# Patient Record
Sex: Male | Born: 1945 | Race: White | Hispanic: No | Marital: Married
Health system: Southern US, Community
[De-identification: ages and names within clinical notes are randomized; demographics above are authoritative.]

---

## 2019-08-23 ENCOUNTER — Ambulatory Visit: Payer: BC Managed Care – PPO | Attending: Internal Medicine

## 2019-08-23 DIAGNOSIS — Z23 Encounter for immunization: Secondary | ICD-10-CM | POA: Insufficient documentation

## 2019-08-23 NOTE — Progress Notes (Signed)
   Covid-[facility] Vaccination [facility]  Name:  Eugene Wall    MRN: [id number] DOB: 11/11/1945  08/23/2019  [name] was observed post Covid-19 immunization for 15 minutes without incidence. He was provided with Vaccine Information Sheet and instruction to access the V-Safe system.   [name] was instructed to call 911 with any severe reactions post vaccine: Marland Kitchen Difficulty breathing  . Swelling of your face and throat  . A fast heartbeat  . A bad rash all over your body  . Dizziness and weakness    Immunizations Administered    Name Date Dose VIS Date Route   Pfizer COVID-19 Vaccine 08/23/2019  1:49 PM 0.3 mL 07/14/2019 Intramuscular   Manufacturer: [employer], [employer]   Lot: [id number]   NDC: [address]-7

## 2019-09-13 ENCOUNTER — Ambulatory Visit: Payer: BC Managed Care – PPO | Attending: Internal Medicine

## 2019-09-13 DIAGNOSIS — Z23 Encounter for immunization: Secondary | ICD-10-CM

## 2019-09-13 NOTE — Progress Notes (Signed)
   Covid-19 Vaccination Clinic  Name:  Eugene Wall    MRN: [id number] DOB: March 11, 1946  09/13/2019  Eugene Wall was observed post Covid-19 immunization for 15 minutes without incidence. He was provided with Vaccine Information Sheet and instruction to access the V-Safe system.   Eugene Wall was instructed to call 911 with any severe reactions post vaccine: Marland Kitchen Difficulty breathing  . Swelling of your face and throat  . A fast heartbeat  . A bad rash all over your body  . Dizziness and weakness    Immunizations Administered    Name Date Dose VIS Date Route   Pfizer COVID-19 Vaccine 09/13/2019 11:48 AM 0.3 mL 07/14/2019 Intramuscular   Manufacturer: [employer], [employer]   Lot: [id number]   NDC: [address]-0

## 2023-04-13 IMAGING — MR MRI BRAIN WITHOUT CONTRAST
7 of 11 series · 23 of 48 positions shown · IV contrast (gadolinium)
Comparison: None

________________________________________________________________________________________________ 
MRI BRAIN WITHOUT CONTRAST, 04/13/2023 [DATE]: 
CLINICAL INDICATION: Decreased cognitive function. Evaluate for stroke.
TECHNIQUE: Multiplanar, multiecho position MR images of the brain were performed 
without intravenous gadolinium enhancement. Patient was scanned on a
magnet.

[Series 102: mpr - smartbrain · axial · 1.1mm · 1.09mm/px · 1 of 2 slices shown]
[im 1/2]
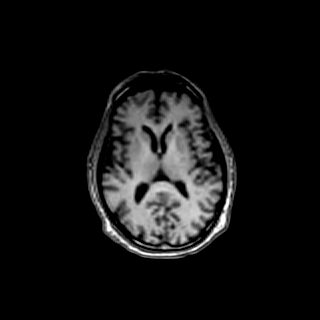

[Series 203: dadc map · coronal · 5.0mm · 0.81mm/px · 3 of 34 slices shown (1 of 2)]
[im 1/34]
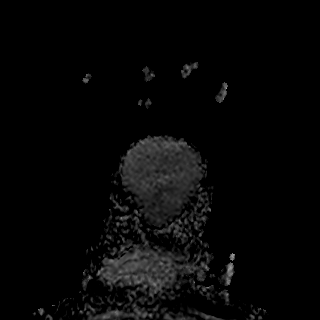
[im 17/34]
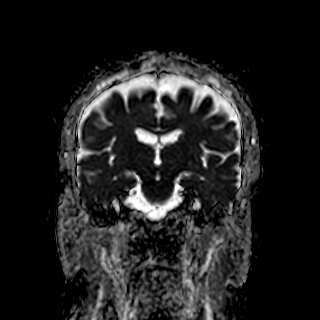
[im 34/34]
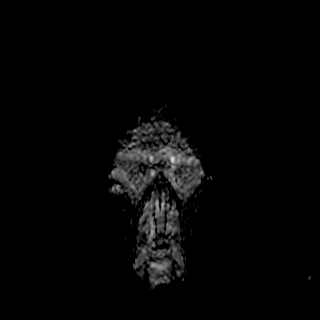

[Series 204: (id) · coronal · 5.0mm · 0.81mm/px · 3 of 34 slices shown (1 of 2)]
[im 1/34]
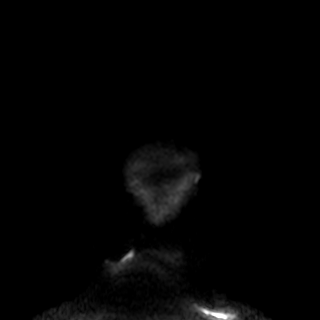
[im 17/34]
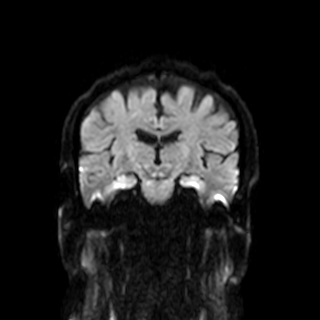
[im 34/34]
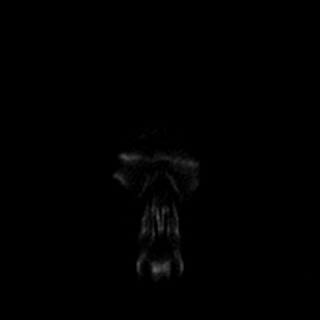

[Series 303: dadc map · axial · 5.0mm · 1.03mm/px · z∈[-75,+81]mm · 2 of 27 slices shown (2 of 2)]
[im 1/27]
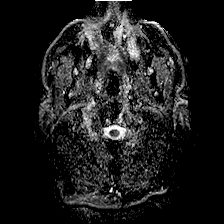
[im 27/27]
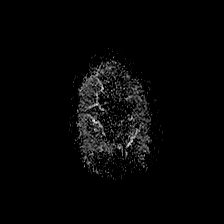

[Series 304: (id) · axial · 5.0mm · 1.03mm/px · z∈[-75,+81]mm · 2 of 27 slices shown (2 of 2)]
[im 1/27]
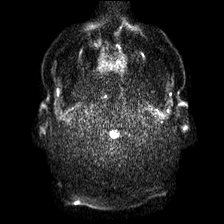
[im 27/27]
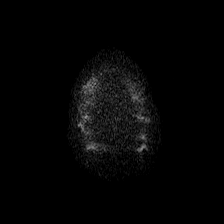

[Series 401: t1_se_sag · sagittal · 4.0mm · 0.39mm/px · 2 of 28 slices shown]
[im 1/28]
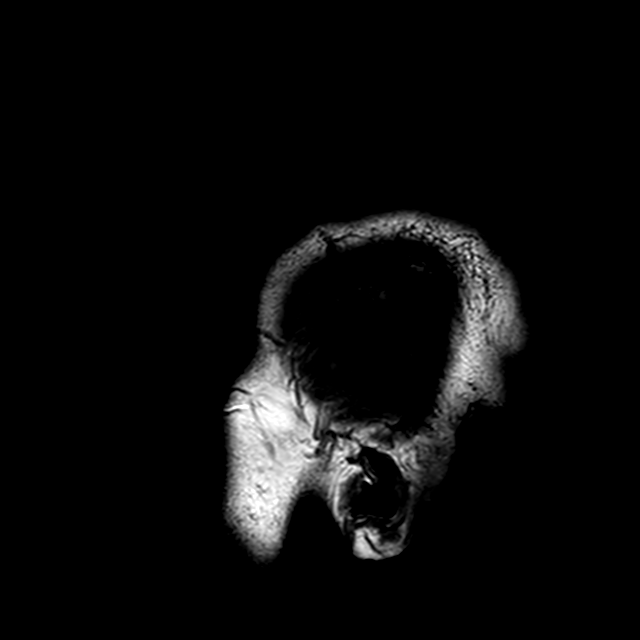
[im 28/28]
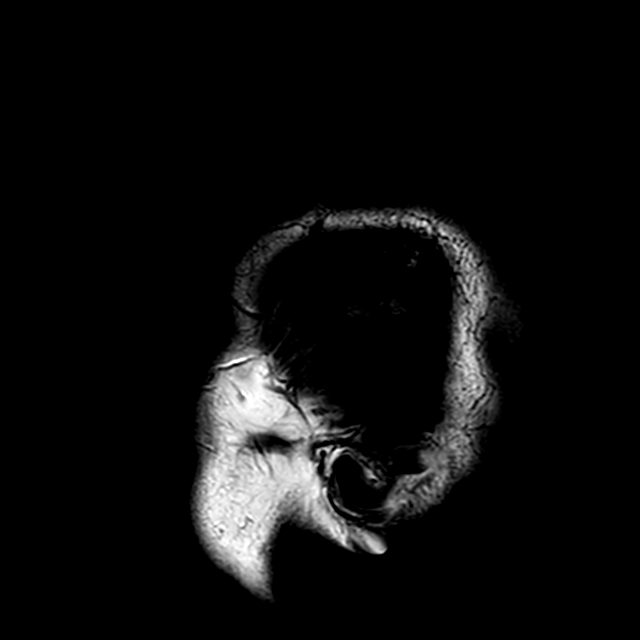

[Series 601: SWI · axial · 3.0mm · 0.40mm/px · z∈[-66,+74]mm · 10 of 200 slices shown]
[im 14/200]
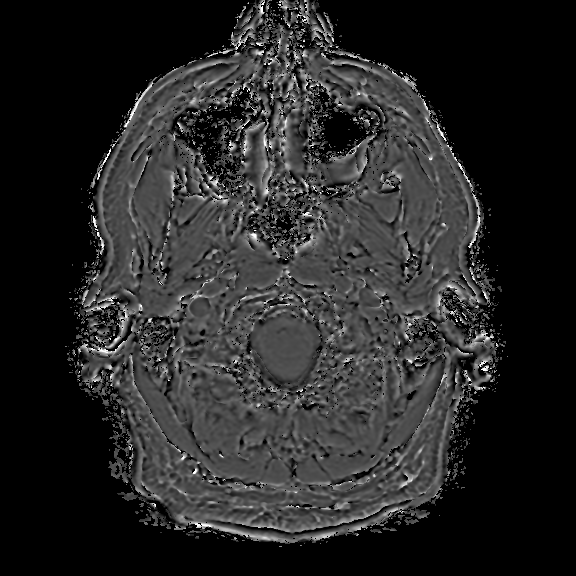
[im 27/200]
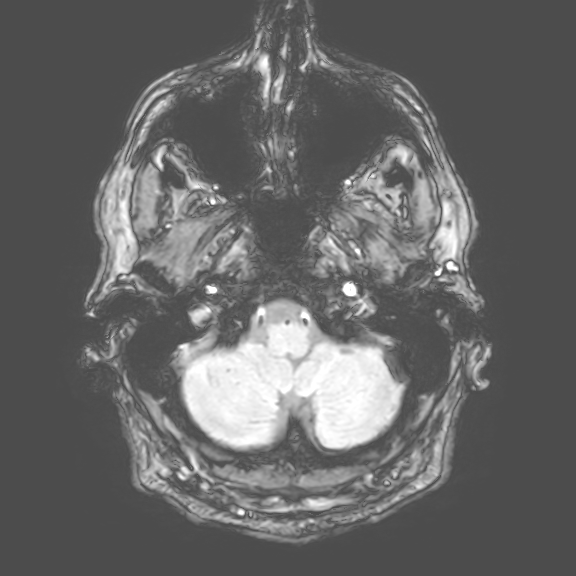
[im 40/200]
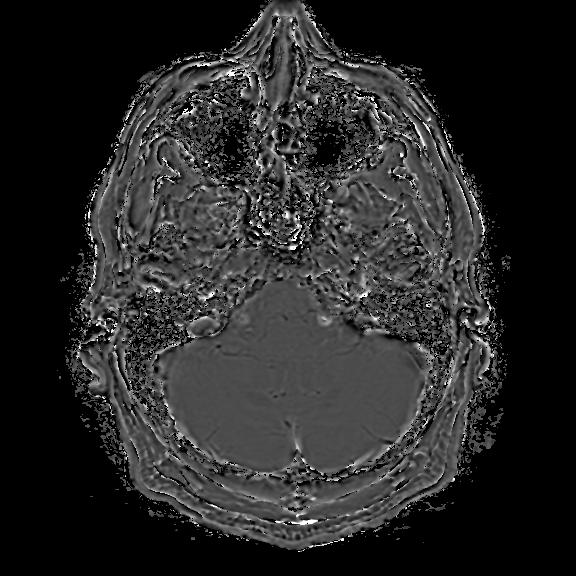
[im 67/200]
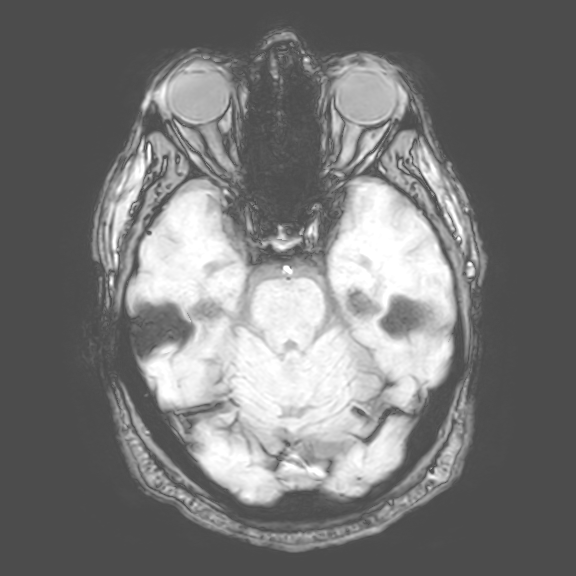
[im 93/200]
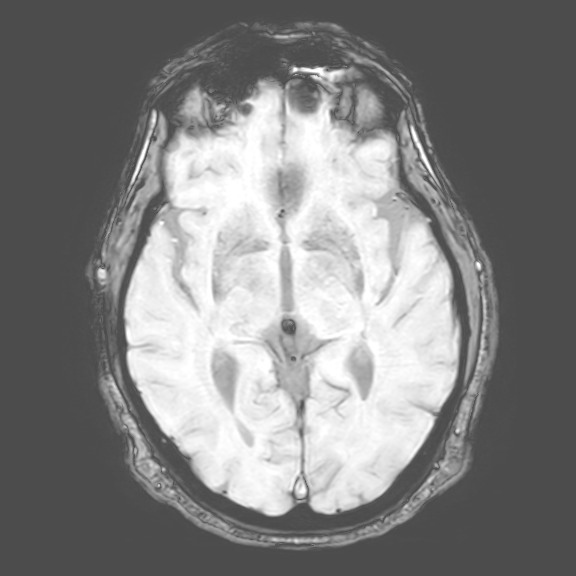
[im 107/200]
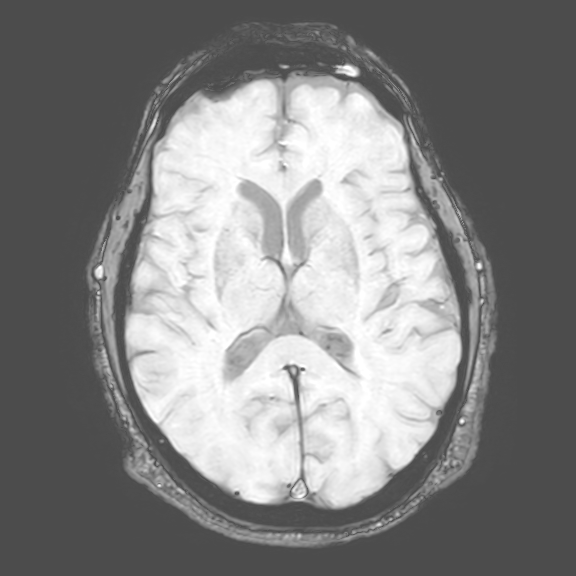
[im 120/200]
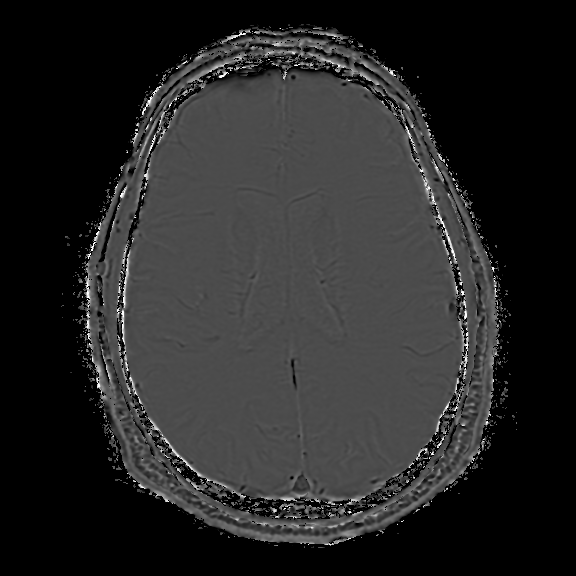
[im 146/200]
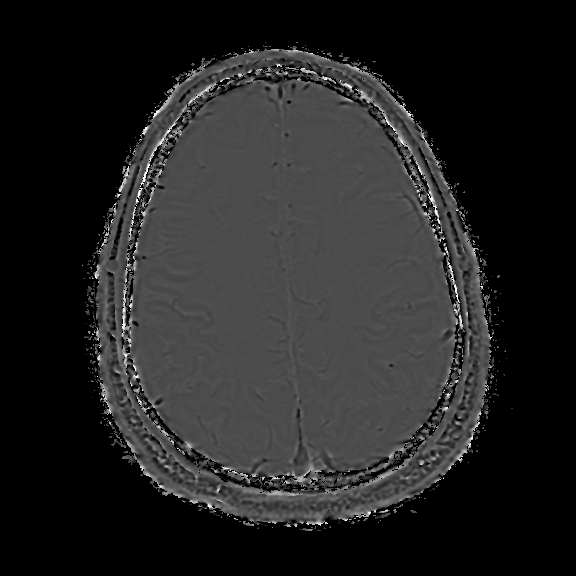
[im 173/200]
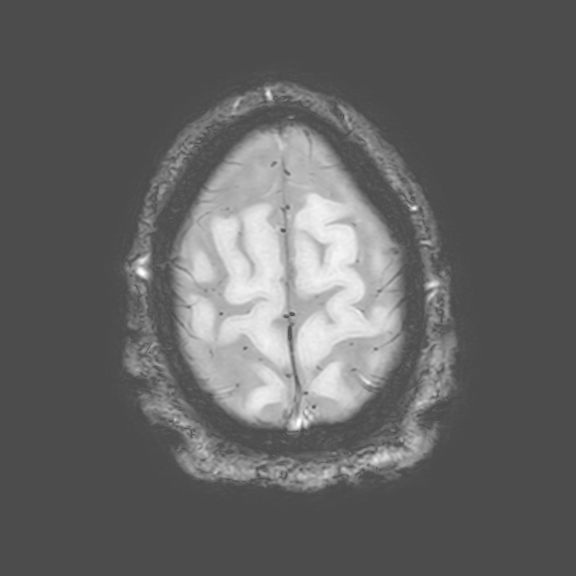
[im 200/200]
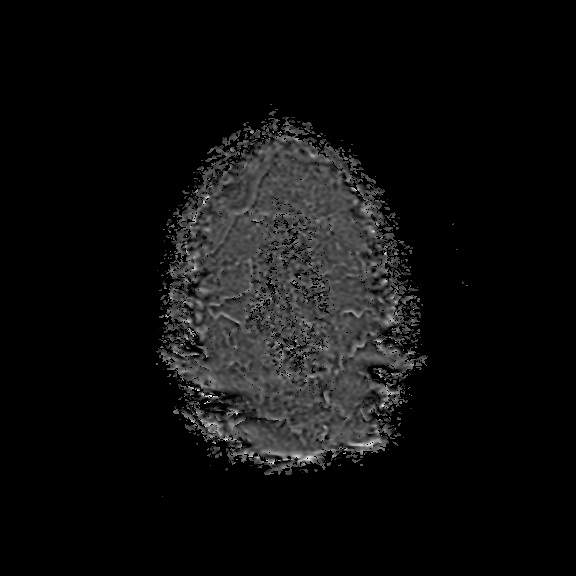

[23 of 48 positions shown; findings below may reference images not displayed]

FINDINGS: -------------------------------------------------------------------------------- 
------------------------- 
INTRACRANIAL: 
Minimal periventricular T-2 FLAIR signal. Otherwise brain parenchymal signal is 
normal for age. No mass or abnormal extra-axial fluid collection. 
No acute ischemia. No abnormal foci of susceptibility artifact in the brain. 
Patency of the intracranial vascular flow voids.  No acute intracranial 
hemorrhage, mass effect, midline shift. No large sellar mass. No hydrocephalus. 
Cerebral volume is age appropriate.  
-------------------------------------------------------------------------------- 
----------------------- 
OTHER: 
ORBITS/SINUSES/T-BONES:  Bilateral pseudophakia.  Mastoid air cells and middle 
ear cavities are grossly clear.  Left maxillary sinus membrane thickening. 
Air-fluid level with membrane thickening left frontal sinus. Mild ethmoid 
membrane thickening. 
MARROW SIGNAL/SOFT TISSUES: No focal suspect signal abnormality.  
-------------------------------------------------------------------------------- 
-------------------
IMPRESSION: No evidence of acute intracranial process. No evidence of focal encephalomalacia 
to suggest prior infarct. 
Acute on chronic left frontal sinusitis. Other mild paranasal sinus membrane 
thickening as above.
